# Patient Record
Sex: Female | Born: 1975 | Hispanic: No | Smoking: Current every day smoker
Health system: Southern US, Community
[De-identification: ages and names within clinical notes are randomized; demographics above are authoritative.]

## PROBLEM LIST (undated history)

## (undated) DIAGNOSIS — F1010 Alcohol abuse, uncomplicated: Secondary | ICD-10-CM

## (undated) HISTORY — PX: OTHER SURGICAL HISTORY: SHX169

---

## 1998-08-12 ENCOUNTER — Ambulatory Visit (HOSPITAL_COMMUNITY): Admission: RE | Admit: 1998-08-12 | Discharge: 1998-08-12 | Payer: Self-pay | Admitting: Family Medicine

## 1999-07-30 ENCOUNTER — Ambulatory Visit (HOSPITAL_COMMUNITY): Admission: RE | Admit: 1999-07-30 | Discharge: 1999-07-30 | Payer: Self-pay | Admitting: *Deleted

## 1999-11-07 ENCOUNTER — Inpatient Hospital Stay (HOSPITAL_COMMUNITY): Admission: AD | Admit: 1999-11-07 | Discharge: 1999-11-08 | Payer: Self-pay | Admitting: *Deleted

## 2019-10-30 ENCOUNTER — Emergency Department (HOSPITAL_BASED_OUTPATIENT_CLINIC_OR_DEPARTMENT_OTHER)
Admission: EM | Admit: 2019-10-30 | Discharge: 2019-10-30 | Disposition: A | Discharge status: Home / Self Care | Payer: Self-pay | Attending: Emergency Medicine | Admitting: Emergency Medicine

## 2019-10-30 ENCOUNTER — Encounter (HOSPITAL_BASED_OUTPATIENT_CLINIC_OR_DEPARTMENT_OTHER): Payer: Self-pay | Admitting: *Deleted

## 2019-10-30 ENCOUNTER — Emergency Department (HOSPITAL_BASED_OUTPATIENT_CLINIC_OR_DEPARTMENT_OTHER): Payer: Self-pay

## 2019-10-30 ENCOUNTER — Other Ambulatory Visit: Payer: Self-pay

## 2019-10-30 DIAGNOSIS — M7989 Other specified soft tissue disorders: Secondary | ICD-10-CM | POA: Insufficient documentation

## 2019-10-30 DIAGNOSIS — F17210 Nicotine dependence, cigarettes, uncomplicated: Secondary | ICD-10-CM | POA: Insufficient documentation

## 2019-10-30 DIAGNOSIS — R600 Localized edema: Secondary | ICD-10-CM

## 2019-10-30 HISTORY — DX: Alcohol abuse, uncomplicated: F10.10

## 2019-10-30 LAB — URINALYSIS, ROUTINE W REFLEX MICROSCOPIC
Bilirubin Urine: NEGATIVE
Glucose, UA: NEGATIVE mg/dL
Ketones, ur: NEGATIVE mg/dL
Nitrite: NEGATIVE
Protein, ur: NEGATIVE mg/dL
Specific Gravity, Urine: <1.005 — ABNORMAL LOW (ref 1.005–1.030)
pH: 6 (ref 5.0–8.0)

## 2019-10-30 LAB — COMPREHENSIVE METABOLIC PANEL
ALT: 22 U/L (ref 0–44)
AST: 49 U/L — ABNORMAL HIGH (ref 15–41)
Albumin: 3.5 g/dL (ref 3.5–5.0)
Alkaline Phosphatase: 106 U/L (ref 38–126)
Anion gap: 15 (ref 5–15)
BUN: <5 mg/dL — ABNORMAL LOW (ref 6–20)
CO2: 17 mmol/L — ABNORMAL LOW (ref 22–32)
Calcium: 8.6 mg/dL — ABNORMAL LOW (ref 8.9–10.3)
Chloride: 96 mmol/L — ABNORMAL LOW (ref 98–111)
Creatinine, Ser: 0.46 mg/dL (ref 0.44–1.00)
GFR calc non Af Amer: >60 mL/min (ref >60)
Glucose, Bld: 150 mg/dL — ABNORMAL HIGH (ref 70–99)
Potassium: 3.7 mmol/L (ref 3.5–5.1)
Sodium: 128 mmol/L — ABNORMAL LOW (ref 135–145)
Total Bilirubin: 0.4 mg/dL (ref 0.3–1.2)
Total Protein: 7.4 g/dL (ref 6.5–8.1)

## 2019-10-30 LAB — URINALYSIS, MICROSCOPIC (REFLEX)

## 2019-10-30 LAB — TROPONIN I (HIGH SENSITIVITY): Troponin I (High Sensitivity): 5 ng/L (ref <18)

## 2019-10-30 LAB — LIPASE, BLOOD: Lipase: 38 U/L (ref 11–51)

## 2019-10-30 LAB — BRAIN NATRIURETIC PEPTIDE: B Natriuretic Peptide: 90.7 pg/mL (ref 0.0–100.0)

## 2019-10-30 MED ORDER — FUROSEMIDE 20 MG PO TABS: 20.0000 mg | ORAL_TABLET | Freq: Every day | ORAL | 0 refills | Status: AC

## 2019-10-30 NOTE — ED Provider Notes (Signed)
[facility] EMERGENCY DEPARTMENT Provider Note   CSN: [id number] Arrival date & time: 10/30/19  1405     History Chief Complaint  Patient presents with  . Leg Swelling    bil    Sarah Ashley is a 44 y.o. female.  Patient is a 44 year old female with no significant past medical history.  She presents with a 1 month history of lower extremity swelling.  She reports swelling of both lower legs from the mid thigh down.  She denies to me she is having any shortness of breath or chest pain.  She denies palpitations.  She denies fevers or chills.  She denies having been on her feet for extended periods of time.  She denies any aggravating factors, but swelling does improve somewhat when she keeps her feet elevated.  The history is provided by the patient.       Past Medical History:  Diagnosis Date  . Alcohol abuse     There are no problems to display for this patient.   History reviewed. No pertinent surgical history.   OB History   No obstetric history on file.     No family history on file.  Social History   Tobacco Use  . Smoking status: Current Every Day Smoker    Packs/day: 1.00    Types: Cigarettes  . Smokeless tobacco: Never Used  Substance Use Topics  . Alcohol use: Yes    Alcohol/week: 6.0 standard drinks    Types: 6 Cans of beer per week  . Drug use: Not Currently    Home Medications Prior to Admission medications   Not on File    Allergies    Patient has no known allergies.  Review of Systems   Review of Systems  All other systems reviewed and are negative.   Physical Exam Updated Vital Signs BP 137/75 (BP Location: Right Arm)   Pulse (!) 103   Temp 98.3 F (36.8 C) (Oral)   Resp 18   Ht 5\' 6"  (1.676 m)   Wt 77.1 kg   LMP 10/16/2019   SpO2 100%   BMI 27.44 kg/m   Physical Exam Vitals and nursing note reviewed.  Constitutional:      General: She is not in acute distress.    Appearance: She is well-developed. She is  not diaphoretic.  HENT:     Head: Normocephalic and atraumatic.  Cardiovascular:     Rate and Rhythm: Normal rate and regular rhythm.     Heart sounds: No murmur heard.  No friction rub. No gallop.   Pulmonary:     Effort: Pulmonary effort is normal. No respiratory distress.     Breath sounds: Normal breath sounds. No wheezing.  Abdominal:     General: Bowel sounds are normal. There is no distension.     Palpations: Abdomen is soft.     Tenderness: There is no abdominal tenderness.  Musculoskeletal:        General: Normal range of motion.     Cervical back: Normal range of motion and neck supple.     Right lower leg: Edema present.     Left lower leg: Edema present.     Comments: There is 2+ pitting edema of both legs in a stocking distribution.  DP pulses are palpable.  There is no redness, warmth, or erythema.  Skin:    General: Skin is warm and dry.  Neurological:     Mental Status: She is alert and oriented to person,  place, and time.     ED Results / Procedures / Treatments   Labs (all labs ordered are listed, but only abnormal results are displayed) Labs Reviewed  COMPREHENSIVE METABOLIC PANEL - Abnormal; Notable for the following components:      Result Value   Sodium 128 (*)    Chloride 96 (*)    CO2 17 (*)    Glucose, Bld 150 (*)    BUN <5 (*)    Calcium 8.6 (*)    AST 49 (*)    All other components within normal limits  BRAIN NATRIURETIC PEPTIDE  LIPASE, BLOOD  URINALYSIS, ROUTINE W REFLEX MICROSCOPIC  TROPONIN I (HIGH SENSITIVITY)    EKG EKG Interpretation  Date/Time:  Wednesday October 30 2019 14:21:19 EDT Ventricular Rate:  113 PR Interval:  124 QRS Duration: 76 QT Interval:  348 QTC Calculation: 477 R Axis:   54 Text Interpretation: Sinus tachycardia Cannot rule out Anterior infarct , age undetermined Abnormal ECG No prior ecg for comparison Confirmed by Geoffery Lyons ([id number]) on 10/30/2019 4:53:28 PM   Radiology No results  found.  Procedures Procedures (including critical care time)  Medications Ordered in ED Medications - No data to display  ED Course  I have reviewed the triage vital signs and the nursing notes.  Pertinent labs & imaging results that were available during my care of the patient were reviewed by me and considered in my medical decision making (see chart for details).    MDM Rules/Calculators/A&P  Patient is a 44 year old female with no significant past medical history presenting with lower leg swelling for the past month.  Patient does have 2-3+ pitting edema of both lower extremities, the etiology of which I am uncertain.  There is no evidence in her work-up for congestive heart failure and studies are negative for DVT.  She has normal renal function.  She denies being on her feet for extended periods of time.  At this point, I feel as though discharge is appropriate.  I will start the patient on a low-dose of Lasix and have her follow-up with cardiology.  She may benefit from an echocardiogram.  Final Clinical Impression(s) / ED Diagnoses Final diagnoses:  None    Rx / DC Orders ED Discharge Orders    None       Geoffery Lyons, MD 10/30/19 [phone]

## 2019-10-30 NOTE — ED Triage Notes (Signed)
C/o bil leg swelling x 1 month

## 2019-10-30 NOTE — Discharge Instructions (Addendum)
Begin taking Lasix as prescribed.  Follow-up with cardiology in the next few days.  The contact information for the cardiology clinic here at [facility] has been provided in this discharge summary for you to call and make these arrangements.  Return to the emergency department if you develop difficulty breathing, chest pain, or other new and concerning symptoms.

## 2019-11-21 DIAGNOSIS — F1010 Alcohol abuse, uncomplicated: Secondary | ICD-10-CM | POA: Insufficient documentation

## 2019-11-26 ENCOUNTER — Encounter: Payer: Self-pay | Admitting: Cardiology

## 2019-11-26 ENCOUNTER — Ambulatory Visit (INDEPENDENT_AMBULATORY_CARE_PROVIDER_SITE_OTHER): Payer: Self-pay | Admitting: Cardiology

## 2019-11-26 ENCOUNTER — Other Ambulatory Visit: Payer: Self-pay

## 2019-11-26 VITALS — BP 150/88 | HR 115 | Ht 66.0 in | Wt 152.1 lb

## 2019-11-26 DIAGNOSIS — Z7289 Other problems related to lifestyle: Secondary | ICD-10-CM

## 2019-11-26 DIAGNOSIS — R011 Cardiac murmur, unspecified: Secondary | ICD-10-CM | POA: Insufficient documentation

## 2019-11-26 DIAGNOSIS — Z720 Tobacco use: Secondary | ICD-10-CM

## 2019-11-26 DIAGNOSIS — W19XXXA Unspecified fall, initial encounter: Secondary | ICD-10-CM

## 2019-11-26 DIAGNOSIS — Y92009 Unspecified place in unspecified non-institutional (private) residence as the place of occurrence of the external cause: Secondary | ICD-10-CM

## 2019-11-26 DIAGNOSIS — Z789 Other specified health status: Secondary | ICD-10-CM | POA: Insufficient documentation

## 2019-11-26 DIAGNOSIS — F1090 Alcohol use, unspecified, uncomplicated: Secondary | ICD-10-CM

## 2019-11-26 DIAGNOSIS — I10 Essential (primary) hypertension: Secondary | ICD-10-CM

## 2019-11-26 DIAGNOSIS — M545 Low back pain, unspecified: Secondary | ICD-10-CM

## 2019-11-26 HISTORY — DX: Alcohol use, unspecified, uncomplicated: F10.90

## 2019-11-26 HISTORY — DX: Unspecified fall, initial encounter: Y92.009

## 2019-11-26 HISTORY — DX: Other specified health status: Z78.9

## 2019-11-26 HISTORY — DX: Other problems related to lifestyle: Z72.89

## 2019-11-26 HISTORY — DX: Tobacco use: Z72.0

## 2019-11-26 HISTORY — DX: Cardiac murmur, unspecified: R01.1

## 2019-11-26 HISTORY — DX: Essential (primary) hypertension: I10

## 2019-11-26 HISTORY — DX: Unspecified fall, initial encounter: W19.XXXA

## 2019-11-26 HISTORY — DX: Low back pain, unspecified: M54.50

## 2019-11-26 MED ORDER — CARVEDILOL 3.125 MG PO TABS: 3.1250 mg | ORAL_TABLET | Freq: Two times a day (BID) | ORAL | 3 refills | Status: DC

## 2019-11-26 NOTE — Patient Instructions (Signed)
Medication Instructions:  Your physician has recommended you make the following change in your medication:  1-START Coreg 3.125 mg by mouth twice daily.  *If you need a refill on your cardiac medications before your next appointment, please call your pharmacy*   Lab Work: Your physician recommends that you have lab work today. BMET and Mg  If you have labs (blood work) drawn today and your tests are completely normal, you will receive your results only by: Marland Kitchen MyChart Message (if you have MyChart) OR . A paper copy in the mail If you have any lab test that is abnormal or we need to change your treatment, we will call you to review the results.   Testing/Procedures: Your physician has requested that you have an echocardiogram. Echocardiography is a painless test that uses sound waves to create images of your heart. It provides your doctor with information about the size and shape of your heart and how well your heart's chambers and valves are working. This procedure takes approximately one hour. There are no restrictions for this procedure.  Non-Cardiac CT scanning of the back, (CAT scanning), is a noninvasive, special x-ray that produces cross-sectional images of the body using x-rays and a computer. CT scans help physicians diagnose and treat medical conditions. For some CT exams, a contrast material is used to enhance visibility in the area of the body being studied. CT scans provide greater clarity and reveal more details than regular x-ray exams.   Follow-Up: At [facility], you and your health needs are our priority.  As part of our continuing mission to provide you with exceptional heart care, we have created designated Provider Care Teams.  These Care Teams include your primary Cardiologist (physician) and Advanced Practice Providers (APPs -  Physician Assistants and Nurse Practitioners) who all work together to provide you with the care you need, when you need it.  We recommend  signing up for the patient portal called "MyChart".  Sign up information is provided on this After Visit Summary.  MyChart is used to connect with patients for Virtual Visits (Telemedicine).  Patients are able to view lab/test results, encounter notes, upcoming appointments, etc.  Non-urgent messages can be sent to your provider as well.   To learn more about what you can do with MyChart, go to [email or web address].    Your next appointment:   1 month(s)  The format for your next appointment:   In Person  Provider:   Thomasene Ripple, DO   Other Instructions You have been referred to orthopedic for back. You have been referred to primary care provider.

## 2019-11-26 NOTE — Progress Notes (Signed)
Cardiology Office Note:    Date:  11/26/2019   ID:  Richrd Sox, DOB [birth year], MRN [id number]  PCP:  Patient, No Pcp Per  Cardiologist:  Thomasene Ripple, DO  Electrophysiologist:  None   Referring MD: No ref. provider found   " I can't walk"  History of Present Illness:    Sarah Ashley is a 44 y.o. female with a hx of tobacco use, bilateral leg edema, was referred from the Emergency department to be evaluated for bilateral leg edema. The patient tells me all started about 3 weeks ago when she was taking care of her granddaughter and try to catch her granddaughter from falling and they both ended up falling.  She did have her knee facing forward and hit her knee the ground.  Ever since that time she has had significant problems with her knee she had seen an orthopedic doctor to tell me but was told that her knee was fine.  Unfortunately the patient states that progressively she has been unable to walk and is now in a wheelchair semipermanently.  During her visit emergency department she was started on Lasix for bilateral leg edema.  She is here today and tells me that the edema has improved.  No other complaints at this time.    Past Medical History:  Diagnosis Date  . Alcohol abuse     Past Surgical History:  Procedure Laterality Date  . no surgical history       Current Medications: Current Meds  Medication Sig  . furosemide (LASIX) 20 MG tablet Take 1 tablet (20 mg total) by mouth daily.     Allergies:   Patient has no known allergies.   Social History   Socioeconomic History  . Marital status: Married    Spouse name: Not on file  . Number of children: Not on file  . Years of education: Not on file  . Highest education level: Not on file  Occupational History  . Not on file  Tobacco Use  . Smoking status: Current Every Day Smoker    Packs/day: 1.00    Types: Cigarettes  . Smokeless tobacco: Never Used  Substance and Sexual Activity  . Alcohol use: Yes     Alcohol/week: 6.0 standard drinks    Types: 6 Cans of beer per week  . Drug use: Not Currently  . Sexual activity: Not on file  Other Topics Concern  . Not on file  Social History Narrative  . Not on file   Social Determinants of Health   Financial Resource Strain:   . Difficulty of Paying Living Expenses: Not on file  Food Insecurity:   . Worried About Programme researcher, broadcasting/film/video in the Last Year: Not on file  . Ran Out of Food in the Last Year: Not on file  Transportation Needs:   . Lack of Transportation (Medical): Not on file  . Lack of Transportation (Non-Medical): Not on file  Physical Activity:   . Days of Exercise per Week: Not on file  . Minutes of Exercise per Session: Not on file  Stress:   . Feeling of Stress : Not on file  Social Connections:   . Frequency of Communication with Friends and Family: Not on file  . Frequency of Social Gatherings with Friends and Family: Not on file  . Attends Religious Services: Not on file  . Active Member of Clubs or Organizations: Not on file  . Attends Banker Meetings: Not on file  .  Marital Status: Not on file     Family History: The patient's family history includes Diabetes in her mother; Prostate cancer in her father.  ROS:   Review of Systems  Constitution: Negative for decreased appetite, fever and weight gain.  HENT: Negative for congestion, ear discharge, hoarse voice and sore throat.   Eyes: Negative for discharge, redness, vision loss in right eye and visual halos.  Cardiovascular: Negative for chest pain, dyspnea on exertion, leg swelling, orthopnea and palpitations.  Respiratory: Negative for cough, hemoptysis, shortness of breath and snoring.   Endocrine: Negative for heat intolerance and polyphagia.  Hematologic/Lymphatic: Negative for bleeding problem. Does not bruise/bleed easily.  Skin: Negative for flushing, nail changes, rash and suspicious lesions.  Musculoskeletal: Negative for arthritis, joint  pain, muscle cramps, myalgias, neck pain and stiffness.  Gastrointestinal: Negative for abdominal pain, bowel incontinence, diarrhea and excessive appetite.  Genitourinary: Negative for decreased libido, genital sores and incomplete emptying.  Neurological: Negative for brief paralysis, focal weakness, headaches and loss of balance.  Psychiatric/Behavioral: Negative for altered mental status, depression and suicidal ideas.  Allergic/Immunologic: Negative for HIV exposure and persistent infections.    EKGs/Labs/Other Studies Reviewed:    The following studies were reviewed today:   EKG:  The ekg ordered today demonstrates sinus tachycardia,109 bpm.  Compared to prior EKG no significant change.  Recent Labs: 10/30/2019: ALT 22; B Natriuretic Peptide 90.7; BUN <5; Creatinine, Ser 0.46; Potassium 3.7; Sodium 128  Recent Lipid Panel No results found for: CHOL, TRIG, HDL, CHOLHDL, VLDL, LDLCALC, LDLDIRECT  Physical Exam:    VS:  BP (!) 150/88 (BP Location: Right Arm)   Pulse (!) 115   Ht 5\' 6"  (1.676 m)   Wt 152 lb 1.6 oz (69 kg)   SpO2 97%   BMI 24.55 kg/m     Wt Readings from Last 3 Encounters:  11/26/19 152 lb 1.6 oz (69 kg)  10/30/19 170 lb (77.1 kg)     GEN: Well nourished, well developed in no acute distress HEENT: Normal NECK: No JVD; No carotid bruits LYMPHATICS: No lymphadenopathy CARDIAC: S1S2 noted,RRR, 2/6 systolic murmurs, rubs, gallops RESPIRATORY:  Clear to auscultation without rales, wheezing or rhonchi  ABDOMEN: Soft, non-tender, non-distended, +bowel sounds, no guarding. EXTREMITIES: No edema, No cyanosis, no clubbing MUSCULOSKELETAL:  No deformity  SKIN: Warm and dry NEUROLOGIC:  Alert and oriented x 3, non-focal PSYCHIATRIC:  Normal affect, good insight  ASSESSMENT:    1. Primary hypertension   2. Acute low back pain, unspecified back pain laterality, unspecified whether sciatica present   3. Current tobacco use   4. Alcohol use   5. Fall as cause of  accidental injury in home as place of occurrence, initial encounter   6. Murmur    PLAN:     1.  Her blood pressure is elevated in the office today.  I like to add carvedilol 3.25 mg to her regimen.  She will stay on the Lasix for now.  Along with the potassium.  Get a get blood work to assess her sodium which has been significantly low 128.  This low sodium is suspected in the case of her daily alcohol use.  2.  Was more profound is her significant positive physical exam in the office in inability to be able to stand and walk independently.  I will order a CTA to assess for lower lobe my.  Will refer the patient to orthopedics as I am concerned.  This young lady who was very active  and after her fall is now subjected to a wheelchair.  She needs significant help from orthopedic.  3.  Bilateral leg edema which has resolved.  4.  Heart murmur appreciated on her physical exam we will get an echocardiogram to assess for any valvular abnormalities.  The patient is in agreement with the above plan. The patient left the office in stable condition.  The patient will follow up in 1 month or sooner if needed.   Medication Adjustments/Labs and Tests Ordered: Current medicines are reviewed at length with the patient today.  Concerns regarding medicines are outlined above.  Orders Placed This Encounter  Procedures  . CT LUMBAR SPINE W WO CONTRAST  . Magnesium  . Basic metabolic panel  . Ambulatory referral to Orthopedic Surgery  . Ambulatory referral to Internal Medicine  . EKG 12-Lead  . ECHOCARDIOGRAM COMPLETE   Meds ordered this encounter  Medications  . carvedilol (COREG) 3.125 MG tablet    Sig: Take 1 tablet (3.125 mg total) by mouth 2 (two) times daily with a meal.    Dispense:  180 tablet    Refill:  3    Patient Instructions  Medication Instructions:  Your physician has recommended you make the following change in your medication:  1-START Coreg 3.125 mg by mouth twice  daily.  *If you need a refill on your cardiac medications before your next appointment, please call your pharmacy*   Lab Work: Your physician recommends that you have lab work today. BMET and Mg  If you have labs (blood work) drawn today and your tests are completely normal, you will receive your results only by: Marland Kitchen MyChart Message (if you have MyChart) OR . A paper copy in the mail If you have any lab test that is abnormal or we need to change your treatment, we will call you to review the results.   Testing/Procedures: Your physician has requested that you have an echocardiogram. Echocardiography is a painless test that uses sound waves to create images of your heart. It provides your doctor with information about the size and shape of your heart and how well your heart's chambers and valves are working. This procedure takes approximately one hour. There are no restrictions for this procedure.  Non-Cardiac CT scanning of the back, (CAT scanning), is a noninvasive, special x-ray that produces cross-sectional images of the body using x-rays and a computer. CT scans help physicians diagnose and treat medical conditions. For some CT exams, a contrast material is used to enhance visibility in the area of the body being studied. CT scans provide greater clarity and reveal more details than regular x-ray exams.   Follow-Up: At [facility], you and your health needs are our priority.  As part of our continuing mission to provide you with exceptional heart care, we have created designated Provider Care Teams.  These Care Teams include your primary Cardiologist (physician) and Advanced Practice Providers (APPs -  Physician Assistants and Nurse Practitioners) who all work together to provide you with the care you need, when you need it.  We recommend signing up for the patient portal called "MyChart".  Sign up information is provided on this After Visit Summary.  MyChart is used to connect with  patients for Virtual Visits (Telemedicine).  Patients are able to view lab/test results, encounter notes, upcoming appointments, etc.  Non-urgent messages can be sent to your provider as well.   To learn more about what you can do with MyChart, go to [email or web address].  Your next appointment:   1 month(s)  The format for your next appointment:   In Person  Provider:   Thomasene RippleKardie Trentan Trippe, DO   Other Instructions You have been referred to orthopedic for back. You have been referred to primary care provider.      Adopting a Healthy Lifestyle.  Know what a healthy weight is for you (roughly BMI <25) and aim to maintain this   Aim for 7+ servings of fruits and vegetables daily   65-80+ fluid ounces of water or unsweet tea for healthy kidneys   Limit to max 1 drink of alcohol per day; avoid smoking/tobacco   Limit animal fats in diet for cholesterol and heart health - choose grass fed whenever available   Avoid highly processed foods, and foods high in saturated/trans fats   Aim for low stress - take time to unwind and care for your mental health   Aim for 150 min of moderate intensity exercise weekly for heart health, and weights twice weekly for bone health   Aim for 7-9 hours of sleep daily   When it comes to diets, agreement about the perfect plan isnt easy to find, even among the experts. Experts at the [employer] developed an idea known as the Healthy Eating Plate. Just imagine a plate divided into logical, healthy portions.   The emphasis is on diet quality:   Load up on vegetables and fruits - one-half of your plate: Aim for color and variety, and remember that potatoes dont count.   Go for whole grains - one-quarter of your plate: Whole wheat, barley, wheat berries, quinoa, oats, brown rice, and foods made with them. If you want pasta, go with whole wheat pasta.   Protein power - one-quarter of your plate: Fish, chicken, beans, and nuts are  all healthy, versatile protein sources. Limit red meat.   The diet, however, does go beyond the plate, offering a few other suggestions.   Use healthy plant oils, such as olive, canola, soy, corn, sunflower and peanut. Check the labels, and avoid partially hydrogenated oil, which have unhealthy trans fats.   If youre thirsty, drink water. Coffee and tea are good in moderation, but skip sugary drinks and limit milk and dairy products to one or two daily servings.   The type of carbohydrate in the diet is more important than the amount. Some sources of carbohydrates, such as vegetables, fruits, whole grains, and beans-are healthier than others.   Finally, stay active  Signed, Thomasene RippleKardie Dante Roudebush, DO  11/26/2019 3:19 PM     [facility]

## 2019-11-27 LAB — BASIC METABOLIC PANEL
BUN/Creatinine Ratio: 8 — ABNORMAL LOW (ref 9–23)
BUN: 4 mg/dL — ABNORMAL LOW (ref 6–24)
CO2: 22 mmol/L (ref 20–29)
Calcium: 9.3 mg/dL (ref 8.7–10.2)
Chloride: 95 mmol/L — ABNORMAL LOW (ref 96–106)
Creatinine, Ser: 0.53 mg/dL — ABNORMAL LOW (ref 0.57–1.00)
GFR calc Af Amer: 134 mL/min/{1.73_m2} (ref >59)
GFR calc non Af Amer: 116 mL/min/{1.73_m2} (ref >59)
Glucose: 79 mg/dL (ref 65–99)
Potassium: 4.5 mmol/L (ref 3.5–5.2)
Sodium: 133 mmol/L — ABNORMAL LOW (ref 134–144)

## 2019-11-27 LAB — MAGNESIUM: Magnesium: 2 mg/dL (ref 1.6–2.3)

## 2019-11-28 ENCOUNTER — Telehealth: Payer: Self-pay

## 2019-11-28 NOTE — Telephone Encounter (Signed)
Left message on patients voicemail to please return our call.   

## 2019-11-28 NOTE — Telephone Encounter (Signed)
-----   Message from Kardie Tobb, DO sent at 11/27/2019 10:30 AM EDT ----- Your sodium is improving.  All the labs are stable. 

## 2019-12-02 ENCOUNTER — Telehealth: Payer: Self-pay

## 2019-12-02 NOTE — Telephone Encounter (Signed)
-----   Message from Thomasene Ripple, DO sent at 11/27/2019 10:30 AM EDT ----- Your sodium is improving.  All the labs are stable.

## 2019-12-02 NOTE — Telephone Encounter (Signed)
Left message on patients voicemail to please return our call.   I will also send the patient a letter at this time.  

## 2019-12-09 ENCOUNTER — Encounter: Payer: Self-pay | Admitting: Orthopaedic Surgery

## 2019-12-09 ENCOUNTER — Ambulatory Visit (INDEPENDENT_AMBULATORY_CARE_PROVIDER_SITE_OTHER): Payer: Self-pay | Admitting: Orthopaedic Surgery

## 2019-12-09 DIAGNOSIS — R29898 Other symptoms and signs involving the musculoskeletal system: Secondary | ICD-10-CM

## 2019-12-09 NOTE — Progress Notes (Signed)
Office Visit Note   Patient: Sarah Ashley           Date of Birth: 1975/03/06           MRN: [id number] Visit Date: 12/09/2019              Requested by: Thomasene Ripple, DO [address] [place],  [place] [address] PCP: Patient, No Pcp Per   Assessment & Plan: Visit Diagnoses:  1. Bilateral leg weakness     Plan: I would like to send her for a MRI of her lumbar spine to rule out any type of nerve compression that is causing her bilateral foot drop.  She will continue her walker for now.  We will see if we can expedite this MRI.  If this is negative we would likely end up recommending a referral to neurology.  This could still certainly be an issue as relates to alcohol use chronically.  All question concerns were answered and addressed.  As soon as we have the MRI results we will move forward with what our next recommendations will be.  Follow-Up Instructions: No follow-ups on file.   Orders:  No orders of the defined types were placed in this encounter.  No orders of the defined types were placed in this encounter.     Procedures: No procedures performed   Clinical Data: No additional findings.   Subjective: Chief Complaint  Patient presents with  . Lower Back - Pain  The patient is someone I am seeing for the first time.  She is a 44 year old female who comes in with bilateral lower extremity weakness.  She reports that she had some type of fall about 2 and half months ago.  She had been seen in the emergency room due to leg swelling.  Doppler ultrasounds of both lower extremities showed no blood clots.  She was eventually sent to cardiology.  At that visit she was having acute low back pain so they ordered a CT scan of her lumbar spine but that was not done.  She was referred to orthopedics.  In the office today she does have a walker that she is using to get around.  She reports weakness in both her feet and the fact that she cannot extending her ankle.  She has knee brace  on her left knee.  When I asked her about the knee brace her left knee sounds like she had had this after an orthopedic visit about 3 weeks ago on [address] which possibly is more frequent orthopedics.  She said they felt that there was potentially an ACL tear and obtain an MRI of her knee and there was no tear.  They placed a steroid injection in her knee.  She reports difficulty sleeping at night.  She does have a history of alcohol abuse.  She states that she still does drink daily and is only 2 beers a day.  That certainly could be more.  She is reporting some balance issues as well.  She is not diabetic.  HPI  Review of Systems She currently denies any fever, chills, nausea, vomiting.  She denies any chest pain or shortness of breath.  She denies any bowel or bladder function changes.  Objective: Vital Signs: There were no vitals taken for this visit.  Physical Exam She is alert and oriented in no acute distress.  She follows the commands appropriately. Ortho Exam I can move both hips knees and ankles easily.  She does  have a significant weakness with extension of both ankles and the inability to really extend both ankles.  This is certainly worrisome. Specialty Comments:  No specialty comments available.  Imaging: No results found.   PMFS History: Patient Active Problem List   Diagnosis Date Noted  . Primary hypertension 11/26/2019  . Acute low back pain 11/26/2019  . Current tobacco use 11/26/2019  . Alcohol use 11/26/2019  . Fall as cause of accidental injury at home as place of occurrence 11/26/2019  . Murmur 11/26/2019  . Alcohol abuse    Past Medical History:  Diagnosis Date  . Alcohol abuse     Family History  Problem Relation Age of Onset  . Diabetes Mother   . Prostate cancer Father     Past Surgical History:  Procedure Laterality Date  . no surgical history      Social History   Occupational History  . Not on file  Tobacco Use  . Smoking status:  Current Every Day Smoker    Packs/day: 1.00    Types: Cigarettes  . Smokeless tobacco: Never Used  Substance and Sexual Activity  . Alcohol use: Yes    Alcohol/week: 6.0 standard drinks    Types: 6 Cans of beer per week  . Drug use: Not Currently  . Sexual activity: Not on file

## 2019-12-12 ENCOUNTER — Ambulatory Visit
Admission: RE | Admit: 2019-12-12 | Discharge: 2019-12-12 | Disposition: A | Discharge status: Home / Self Care | Payer: No Typology Code available for payment source | Source: Ambulatory Visit | Attending: Orthopaedic Surgery | Admitting: Orthopaedic Surgery

## 2019-12-12 ENCOUNTER — Other Ambulatory Visit: Payer: Self-pay

## 2019-12-12 DIAGNOSIS — R29898 Other symptoms and signs involving the musculoskeletal system: Secondary | ICD-10-CM

## 2019-12-18 ENCOUNTER — Other Ambulatory Visit: Payer: Self-pay

## 2019-12-18 ENCOUNTER — Ambulatory Visit (HOSPITAL_BASED_OUTPATIENT_CLINIC_OR_DEPARTMENT_OTHER)
Admission: RE | Admit: 2019-12-18 | Discharge: 2019-12-18 | Disposition: A | Discharge status: Home / Self Care | Payer: Self-pay | Source: Ambulatory Visit | Attending: Cardiology | Admitting: Cardiology

## 2019-12-18 DIAGNOSIS — Z7289 Other problems related to lifestyle: Secondary | ICD-10-CM | POA: Insufficient documentation

## 2019-12-18 DIAGNOSIS — Z720 Tobacco use: Secondary | ICD-10-CM | POA: Insufficient documentation

## 2019-12-18 DIAGNOSIS — Z789 Other specified health status: Secondary | ICD-10-CM

## 2019-12-18 DIAGNOSIS — I10 Essential (primary) hypertension: Secondary | ICD-10-CM | POA: Insufficient documentation

## 2019-12-18 LAB — ECHOCARDIOGRAM COMPLETE
Area-P 1/2: 5.23 cm2
S' Lateral: 2.78 cm

## 2019-12-23 ENCOUNTER — Ambulatory Visit: Payer: Self-pay | Admitting: Cardiology

## 2019-12-24 ENCOUNTER — Telehealth: Payer: Self-pay

## 2019-12-24 NOTE — Telephone Encounter (Signed)
Left message on patients voicemail to please return our call.    Interpreter used with ID # [id number]

## 2019-12-24 NOTE — Telephone Encounter (Signed)
-----   Message from Thomasene Ripple, DO sent at 12/24/2019  4:43 PM EST ----- The echo showed that the heart is not fully relaxing like it should ( diastolic dysfunction) ,but otherwise normal. I will discuss it at the next office visit.

## 2020-03-12 ENCOUNTER — Other Ambulatory Visit: Payer: Self-pay | Admitting: Cardiology

## 2022-02-02 IMAGING — MR MR LUMBAR SPINE W/O CM
4 of 5 series · 18 of 48 positions shown · non-contrast
Comparison: None.

CLINICAL DATA: Low back pain bilateral leg pain with foot drop and
swelling, left worse than right.

EXAM:
MRI LUMBAR SPINE WITHOUT CONTRAST
TECHNIQUE: Multiplanar, multisequence MR imaging of the lumbar spine was
performed. No intravenous contrast was administered.

[Series 5: T2 · sagittal · 4.0mm · 0.73mm/px · 6 of 15 slices shown (1 of 2)]
[im 1/15]
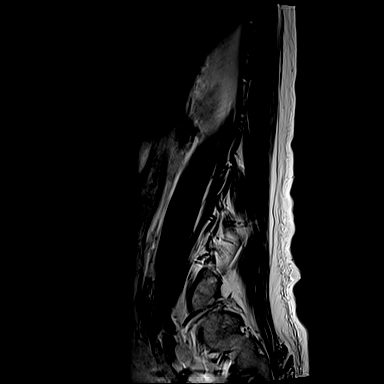
[im 3/15]
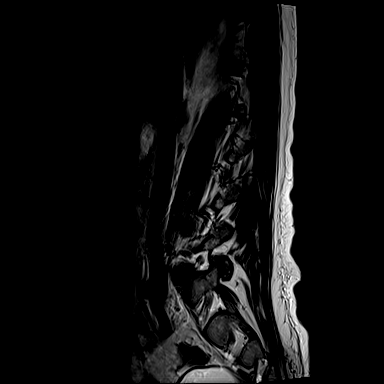
[im 6/15]
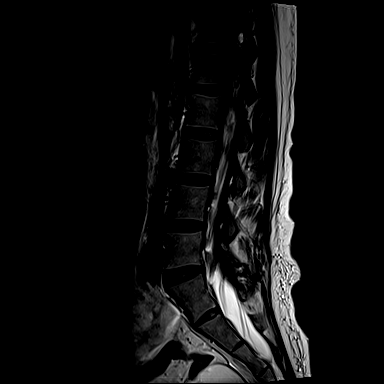
[im 9/15]
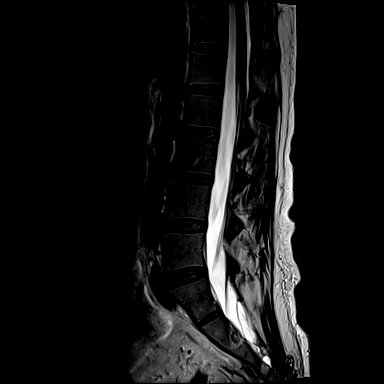
[im 12/15]
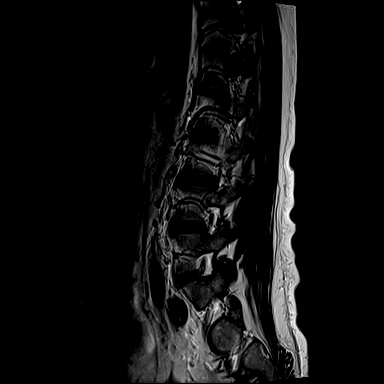
[im 15/15]
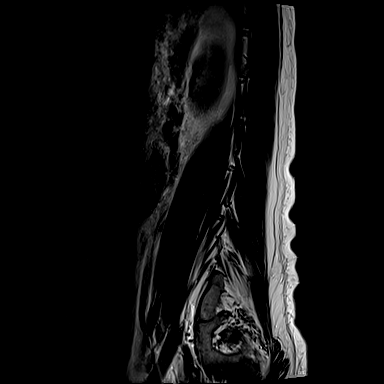

[Series 6: T1 · sagittal · 4.0mm · 0.73mm/px · 3 of 15 slices shown (1 of 2)]
[im 3/15]
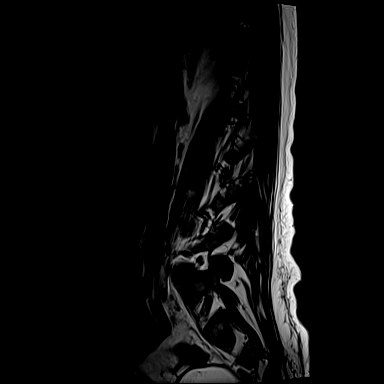
[im 9/15]
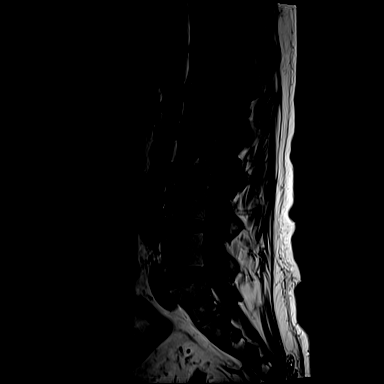
[im 15/15]
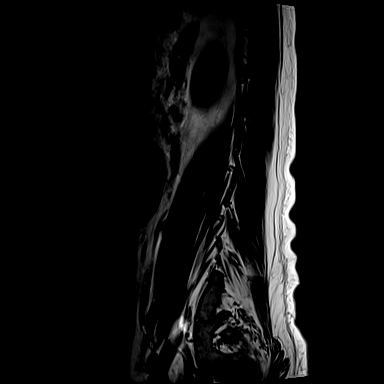

[Series 12: T2 · axial · 4.0mm · 0.28mm/px · z∈[-120,+53]mm · 6 of 41 slices shown (2 of 2)]
[im 1/41]
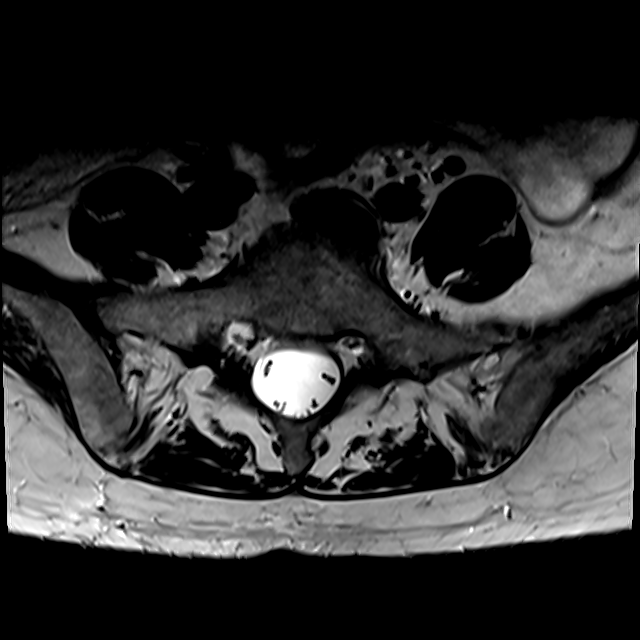
[im 6/41]
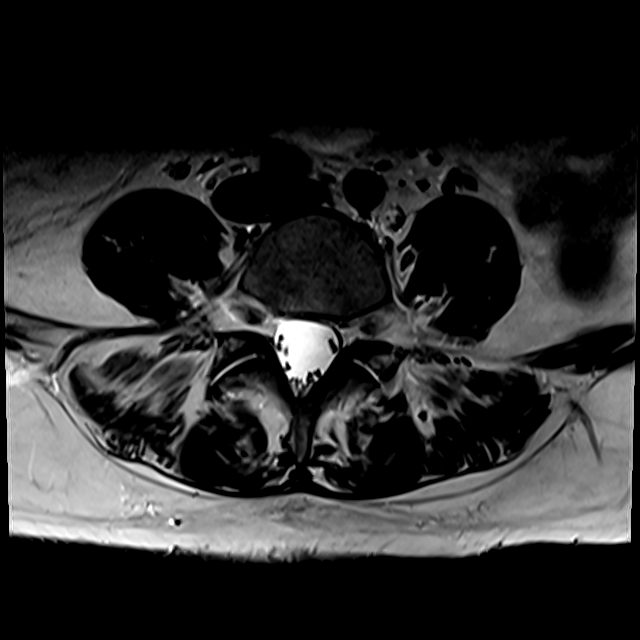
[im 12/41]
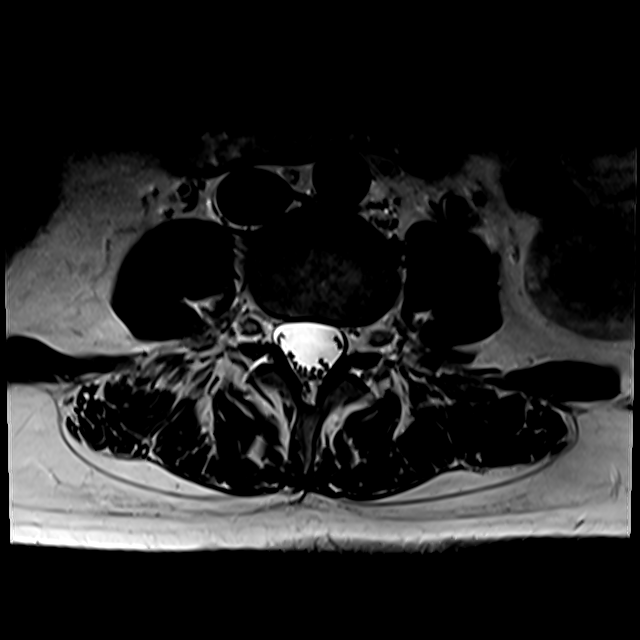
[im 18/41]
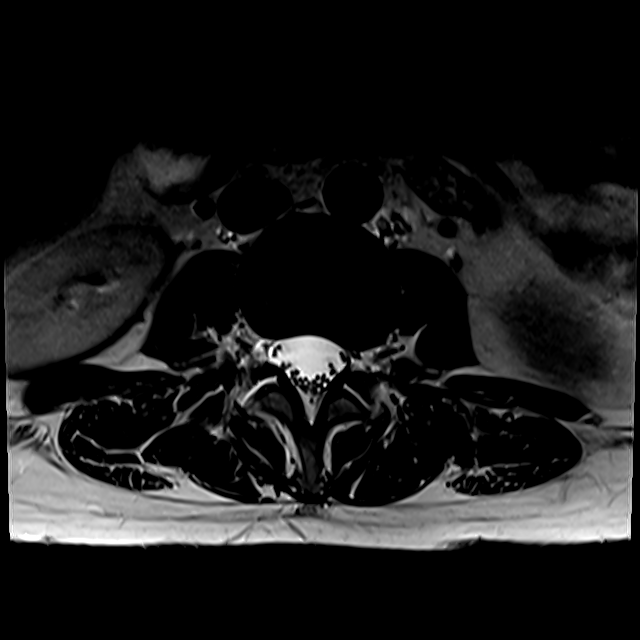
[im 21/41]
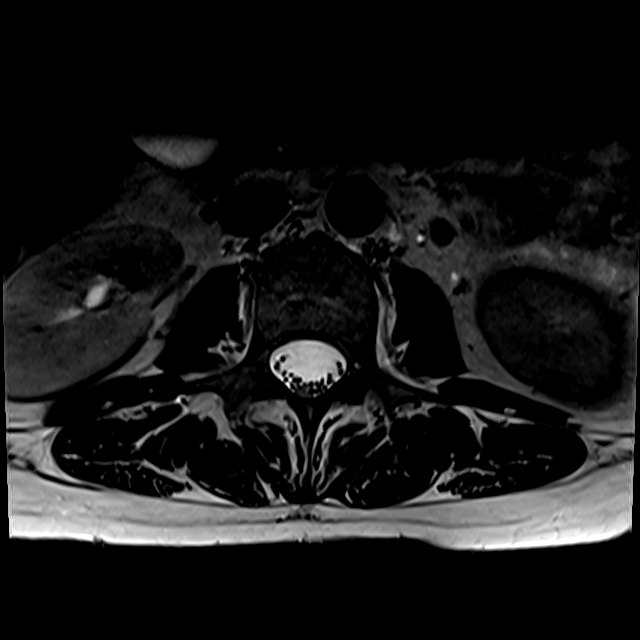
[im 35/41]
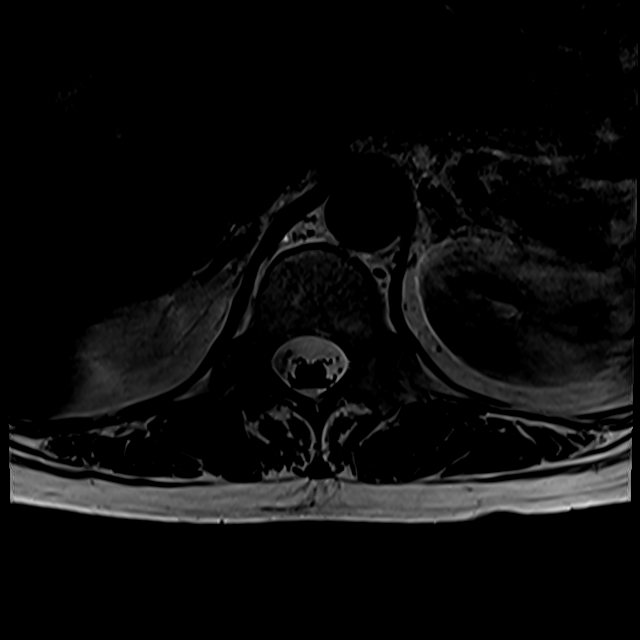

[Series 100: T1 · axial · 4.0mm · 0.28mm/px · z∈[-95,+53]mm · 3 of 41 slices shown (2 of 2)]
[im 6/41]
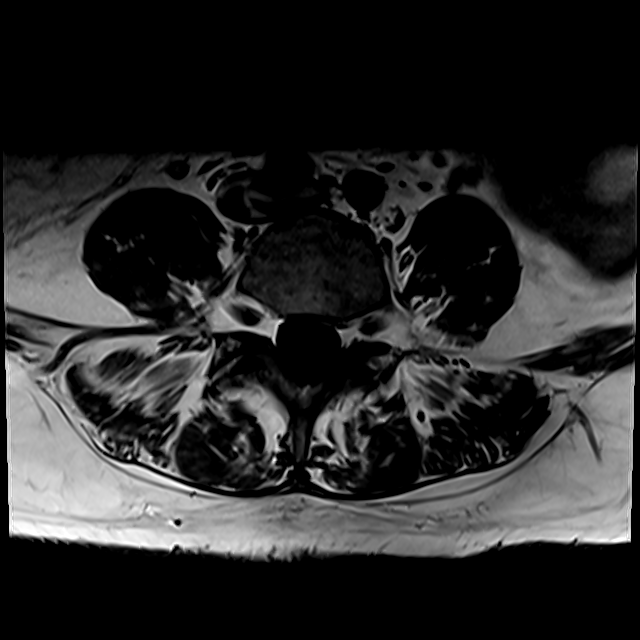
[im 21/41]
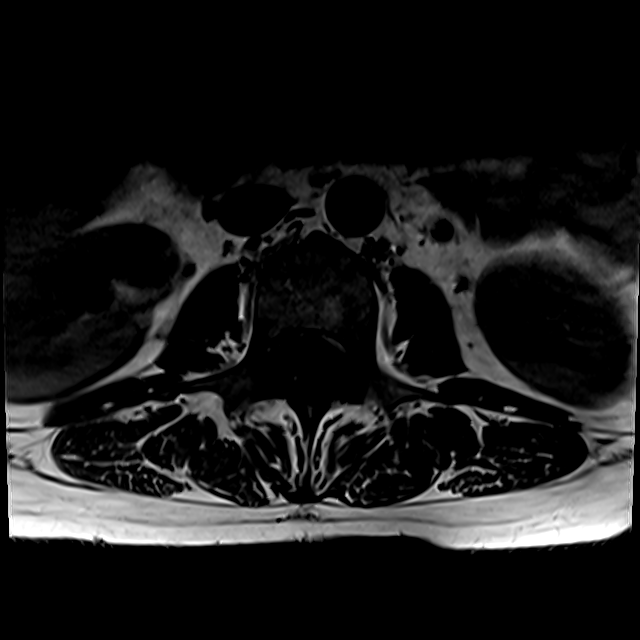
[im 35/41]
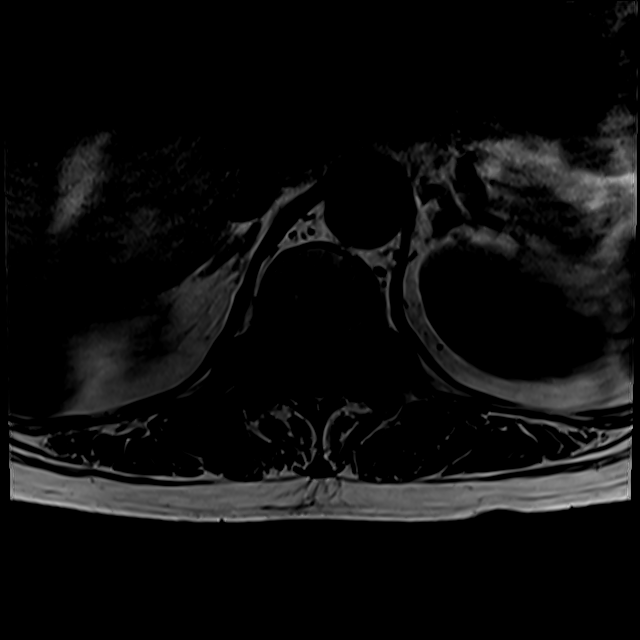

[18 of 48 positions shown; findings below may reference images not displayed]

FINDINGS: Segmentation: Transitional lumbosacral anatomy with sacralization of
L5.

Alignment:  No substantial subluxation.  Mild broad levocurvature.

Vertebrae: No fracture, evidence of discitis, or suspicious bone
lesion.

Conus medullaris and cauda equina: Conus extends to the L1 level.
Conus and cauda equina appear normal.

Paraspinal and other soft tissues: Unremarkable.

Disc levels:

T11-T12: No significant disc protrusion, foraminal stenosis, or
canal stenosis.

T12-L1: No significant disc protrusion, foraminal stenosis, or canal
stenosis.

L1-L2: No significant disc protrusion, foraminal stenosis, or canal
stenosis.

L2-L3: Mild degenerative disc desiccation and height loss. Mild
facet arthropathy with small facet joint effusions. Small left
foraminal disc protrusion without significant canal or foraminal
stenosis.

L3-L4: Mild degenerative disc desiccation and height loss. Mild
facet arthropathy with small facet joint effusions. No significant
canal or foraminal stenosis.

L4-L5: No significant disc protrusion, foraminal stenosis, or canal
stenosis. Mild bilateral facet arthropathy.

L5-S1: No significant disc protrusion, foraminal stenosis, or canal
stenosis.
IMPRESSION: 1. Mild multilevel degenerative change without significant canal or
foraminal stenosis.
2. Transitional lumbosacral anatomy with sacralization of L5.
